# Patient Record
Sex: Male | Born: 1980
Health system: Southern US, Community
[De-identification: ages and names within clinical notes are randomized; demographics above are authoritative.]

## PROBLEM LIST (undated history)

## (undated) DIAGNOSIS — I10 Essential (primary) hypertension: Secondary | ICD-10-CM

## (undated) DIAGNOSIS — L409 Psoriasis, unspecified: Secondary | ICD-10-CM

## (undated) DIAGNOSIS — K219 Gastro-esophageal reflux disease without esophagitis: Secondary | ICD-10-CM

## (undated) DIAGNOSIS — M5412 Radiculopathy, cervical region: Secondary | ICD-10-CM

## (undated) HISTORY — DX: Essential (primary) hypertension: I10

## (undated) HISTORY — DX: Gastro-esophageal reflux disease without esophagitis: K21.9

## (undated) HISTORY — DX: Radiculopathy, cervical region: M54.12

## (undated) HISTORY — PX: TONSILLECTOMY: SUR1361

## (undated) HISTORY — DX: Psoriasis, unspecified: L40.9

---

## 2004-07-28 ENCOUNTER — Ambulatory Visit: Payer: Self-pay | Admitting: Family Medicine

## 2015-10-01 ENCOUNTER — Other Ambulatory Visit: Payer: Self-pay | Admitting: Orthopaedic Surgery

## 2015-10-01 DIAGNOSIS — M25512 Pain in left shoulder: Secondary | ICD-10-CM

## 2015-10-12 ENCOUNTER — Other Ambulatory Visit: Payer: Self-pay

## 2015-10-12 ENCOUNTER — Ambulatory Visit
Admission: RE | Admit: 2015-10-12 | Discharge: 2015-10-12 | Disposition: A | Discharge status: Home / Self Care | Payer: 59 | Source: Ambulatory Visit | Attending: Orthopaedic Surgery | Admitting: Orthopaedic Surgery

## 2015-10-12 DIAGNOSIS — M25512 Pain in left shoulder: Secondary | ICD-10-CM

## 2015-10-30 ENCOUNTER — Other Ambulatory Visit: Payer: Self-pay | Admitting: Neurosurgery

## 2015-10-30 DIAGNOSIS — M4722 Other spondylosis with radiculopathy, cervical region: Secondary | ICD-10-CM

## 2015-11-02 ENCOUNTER — Ambulatory Visit
Admission: RE | Admit: 2015-11-02 | Discharge: 2015-11-02 | Disposition: A | Discharge status: Home / Self Care | Payer: 59 | Source: Ambulatory Visit | Attending: Neurosurgery | Admitting: Neurosurgery

## 2015-11-02 DIAGNOSIS — M4722 Other spondylosis with radiculopathy, cervical region: Secondary | ICD-10-CM

## 2016-09-01 ENCOUNTER — Encounter (INDEPENDENT_AMBULATORY_CARE_PROVIDER_SITE_OTHER): Payer: Self-pay | Admitting: Orthopedic Surgery

## 2016-09-01 ENCOUNTER — Ambulatory Visit (INDEPENDENT_AMBULATORY_CARE_PROVIDER_SITE_OTHER): Payer: 59

## 2016-09-01 ENCOUNTER — Ambulatory Visit (INDEPENDENT_AMBULATORY_CARE_PROVIDER_SITE_OTHER): Payer: 59 | Admitting: Orthopedic Surgery

## 2016-09-01 VITALS — BP 125/59 | HR 86 | Resp 12 | Ht 71.0 in | Wt 185.0 lb

## 2016-09-01 DIAGNOSIS — M25511 Pain in right shoulder: Secondary | ICD-10-CM

## 2016-09-01 DIAGNOSIS — M7541 Impingement syndrome of right shoulder: Secondary | ICD-10-CM

## 2016-09-01 MED ORDER — METHYLPREDNISOLONE ACETATE 40 MG/ML IJ SUSP
80.0000 mg | INTRAMUSCULAR | Status: AC | PRN
  Administered 2016-09-01: 80 mg

## 2016-09-01 MED ORDER — BUPIVACAINE HCL 0.5 % IJ SOLN
2.0000 mL | INTRAMUSCULAR | Status: AC | PRN
  Administered 2016-09-01: 2 mL via INTRA_ARTICULAR

## 2016-09-01 MED ORDER — LIDOCAINE HCL 1 % IJ SOLN
2.0000 mL | INTRAMUSCULAR | Status: AC | PRN
Start: 2016-09-01 — End: 2016-09-01
  Administered 2016-09-01: 2 mL

## 2016-09-01 NOTE — Progress Notes (Signed)
Office Visit Note   Patient: Derrick BanningMatthew Munoz           Date of Birth: [birth year]           MRN: [id number] Visit Date: 09/01/2016              Requested by: No referring provider defined for this encounter. PCP: No PCP Per Patient   Assessment & Plan: Visit Diagnoses:  1. Impingement syndrome of right shoulder   2. Right shoulder pain, unspecified chronicity     Plan:  #1: Steroid injection to the subacromial space was performed. Postinjection he had no pain in any maneuvers. #2: If he would like I will reinject the left shoulder if he so desires  Follow-Up Instructions: No Follow-up on file.   Orders:  Orders Placed This Encounter  Procedures  . XR Shoulder Right   No orders of the defined types were placed in this encounter.     Procedures: Large Joint Inj Date/Time: 09/01/2016 5:19 PM Performed by: Jacqualine CodePETRARCA, BRIAN D Authorized by: Jacqualine CodePETRARCA, BRIAN D   Consent Given by:  Patient Timeout: prior to procedure the correct patient, procedure, and site was verified   Indications:  Pain Location:  Shoulder Site:  L subacromial bursa Prep: patient was prepped and draped in usual sterile fashion   Needle Size:  25 G Needle Length:  1.5 inches Approach:  Lateral Ultrasound Guidance: No   Fluoroscopic Guidance: No   Arthrogram: No   Medications:  80 mg methylPREDNISolone acetate 40 MG/ML; 2 mL lidocaine 1 %; 2 mL bupivacaine 0.5 % Aspiration Attempted: No   Patient tolerance:  Patient tolerated the procedure well with no immediate complications     Clinical Data: No additional findings.   Subjective: Chief Complaint  Patient presents with  . Left Shoulder - Pain  . Right Shoulder - Pain    Bil. Shoulder pain, right worse than left, no surgery to shoulders, not diabetic  Right shoulder pain x 2 months, no injury, popping, lifting weights over head causes pain Left shoulder pain x 2 years, no injury, dull, popping, MRI shows bone spurs, bursitis  Derrick Munoz  has been working out doing a lot of overhead lifting which I believe is part of the reason why his having this increasing pain discomfort. His right shoulder at this time is the worst. He does state the left one is still painful. That one was along with an MRI scan which has an os acromiale he noted.    Review of Systems  Constitutional: Negative.   HENT: Negative.   Respiratory: Negative.   Cardiovascular: Negative.   Gastrointestinal: Negative.   Genitourinary: Negative.   Skin: Negative.   Neurological: Negative.   Hematological: Negative.   Psychiatric/Behavioral: Negative.      Objective: Vital Signs: BP (!) 125/59 (BP Location: Right Arm, Patient Position: Sitting, Cuff Size: Normal)   Pulse 86   Resp 12   Ht 5\' 11"  (1.803 m)   Wt 185 lb (83.9 kg)   BMI 25.80 kg/m   Physical Exam  Constitutional: He is oriented to person, place, and time. He appears well-developed and well-nourished.  HENT:  Head: Normocephalic and atraumatic.  Eyes: EOM are normal. Pupils are equal, round, and reactive to light.  Pulmonary/Chest: Effort normal.  Neurological: He is alert and oriented to person, place, and time.  Skin: Skin is warm and dry.  Psychiatric: He has a normal mood and affect. His behavior is normal. Judgment and thought content normal.  Right Shoulder Exam   Range of Motion  Active Abduction: 140  Passive Abduction: 170  Forward Flexion: 170  External Rotation: 70  Internal Rotation 90 degrees: 40   Muscle Strength  Abduction: 4/5  Internal Rotation: 4/5  External Rotation: 4/5  Supraspinatus: 4/5  Subscapularis: 4/5   Tests  Impingement: positive  Other  Erythema: absent Sensation: normal Pulse: present  Comments:  Pain with internal rotation "empty can" testing both forward flexion and abduction.  Post injection he had no pain in any of the planes and he had no pain with empty can testing.      Specialty Comments:  No specialty comments  available.  Imaging: No results found.   PMFS History: There are no active problems to display for this patient.  Past Medical History:  Diagnosis Date  . Psoriasis     Family History  Problem Relation Age of Onset  . Hypertension Father     Past Surgical History:  Procedure Laterality Date  . TONSILLECTOMY     Social History   Occupational History  . Not on file.   Social History Main Topics  . Smoking status: Former Smoker    Packs/day: 1.00    Years: 11.00    Types: Cigarettes    Quit date: 09/01/2008  . Smokeless tobacco: Never Used  . Alcohol use Yes     Comment: 3-4 beers per weekend  . Drug use: No  . Sexual activity: Not on file

## 2017-02-08 IMAGING — MR MR SHOULDER*L* W/O CM
4 of 5 series · 30 of 40 positions shown · non-contrast
Comparison: None.

CLINICAL DATA: Chronic left shoulder pain worsening over the past 4
years. Painful range of motion.

EXAM:
MRI OF THE LEFT SHOULDER WITHOUT CONTRAST
TECHNIQUE: Multiplanar, multisequence MR imaging of the shoulder was performed.
No intravenous contrast was administered.

[Series 5: T2 fat-sat · axial · 4.0mm · 0.55mm/px · z∈[-50,+26]mm · 8 of 17 slices shown (1 of 3)]
[im 1/17]
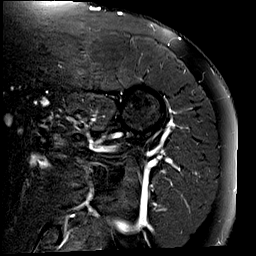
[im 3/17]
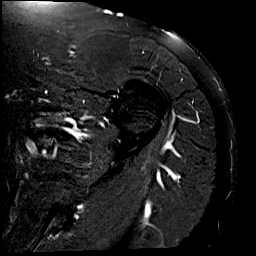
[im 5/17]
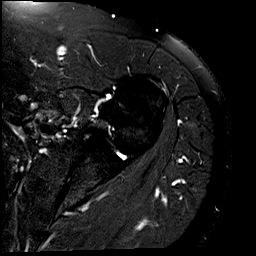
[im 7/17]
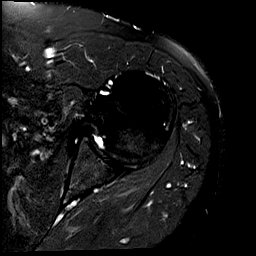
[im 10/17]
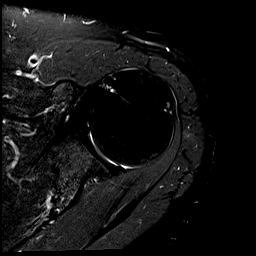
[im 12/17]
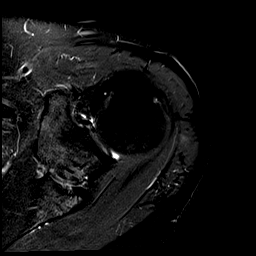
[im 14/17]
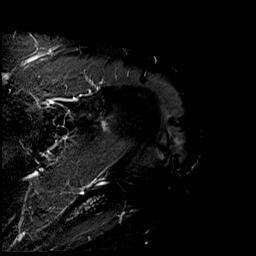
[im 17/17]
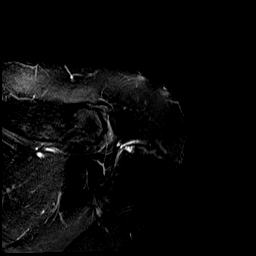

[Series 7: T2 fat-sat · oblique · 4.0mm · 0.59mm/px · 8 of 17 slices shown (2 of 3)]
[im 1/17]
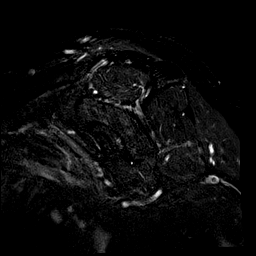
[im 3/17]
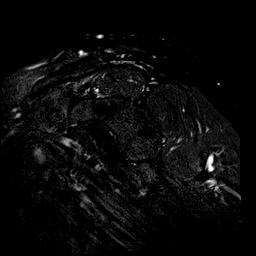
[im 5/17]
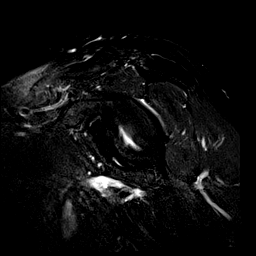
[im 7/17]
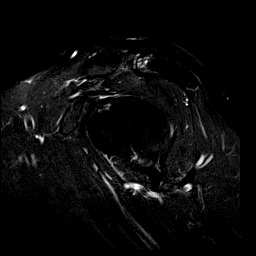
[im 10/17]
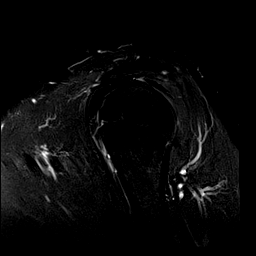
[im 12/17]
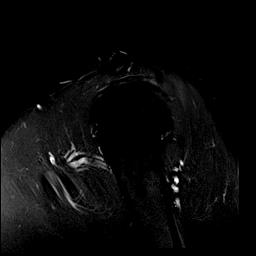
[im 14/17]
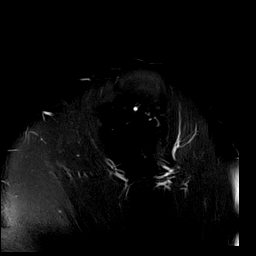
[im 17/17]
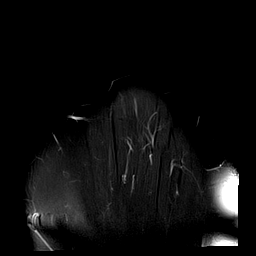

[Series 10: PD · oblique · 4.0mm · 0.29mm/px · 8 of 19 slices shown]
[im 1/19]
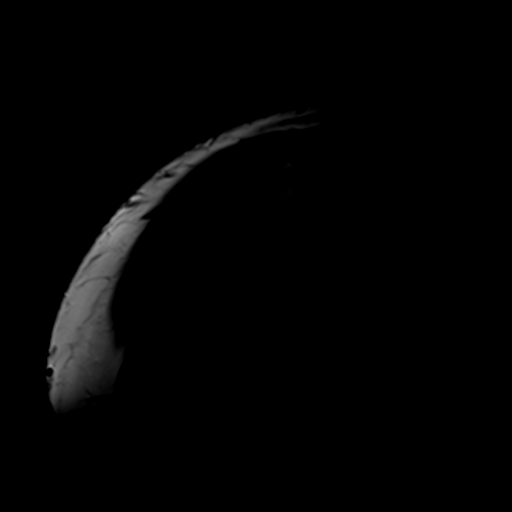
[im 3/19]
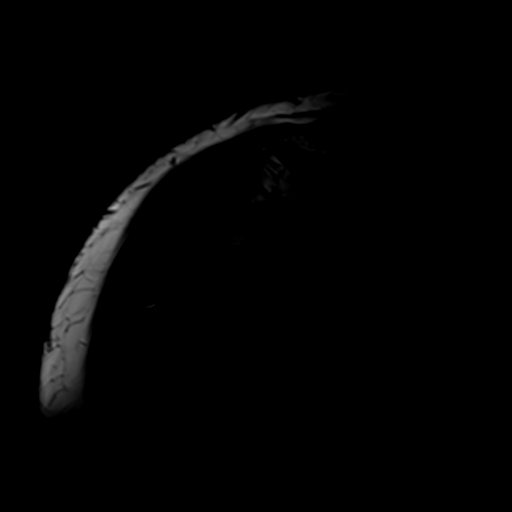
[im 6/19]
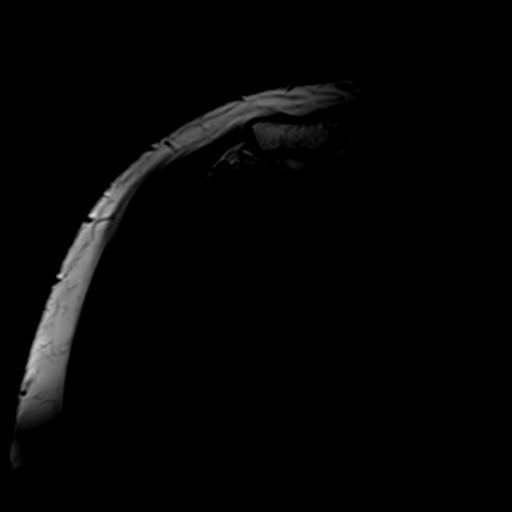
[im 8/19]
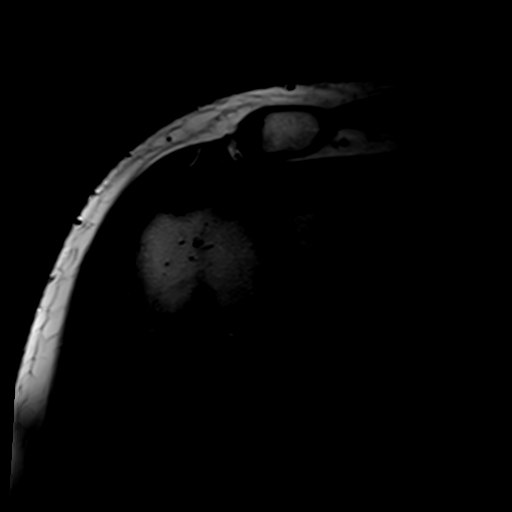
[im 11/19]
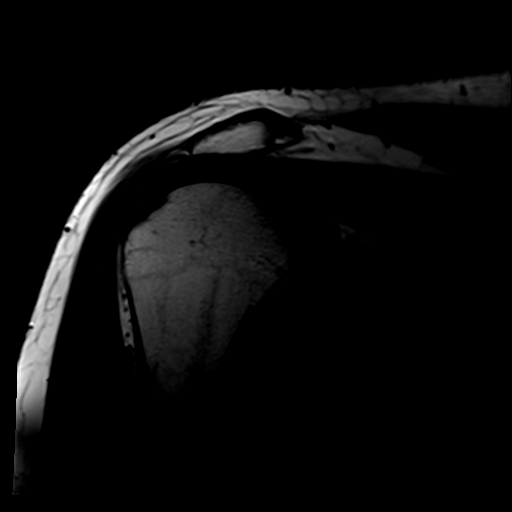
[im 13/19]
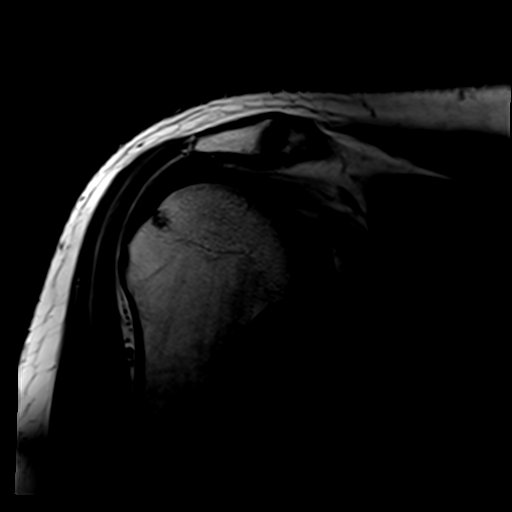
[im 16/19]
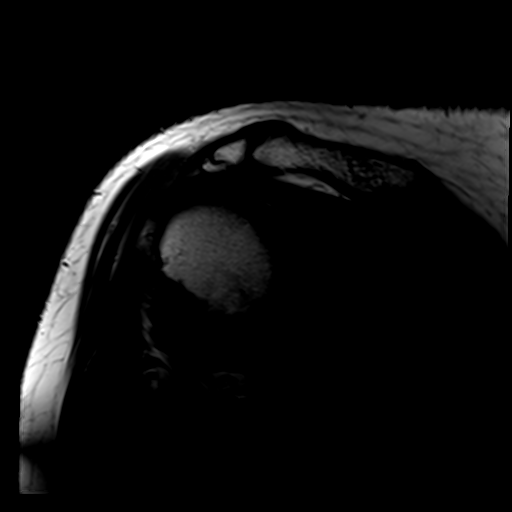
[im 19/19]
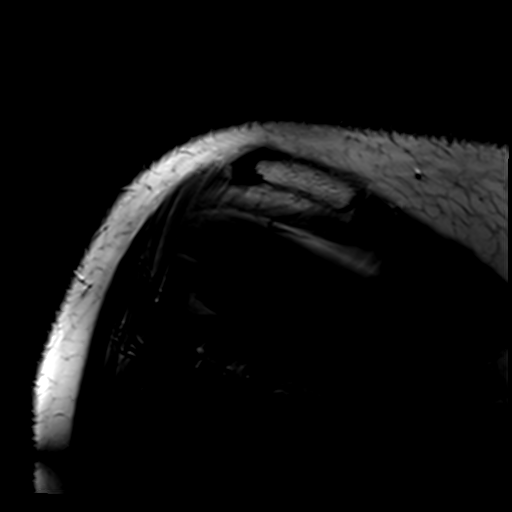

[Series 11: T2 fat-sat · oblique · 4.0mm · 0.29mm/px · 6 of 19 slices shown (3 of 3)]
[im 1/19]
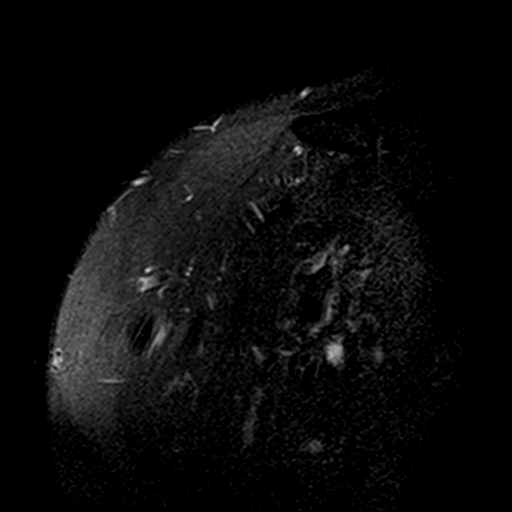
[im 3/19]
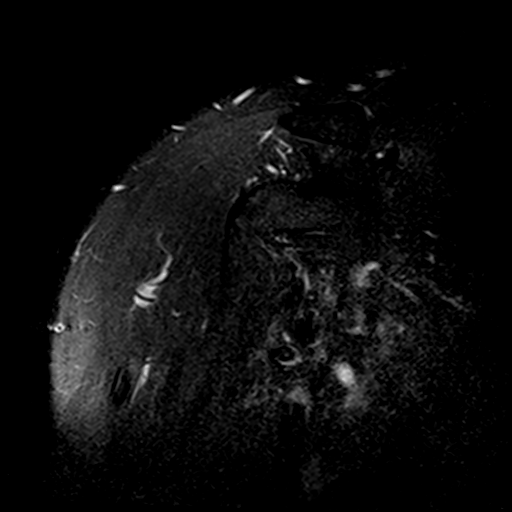
[im 6/19]
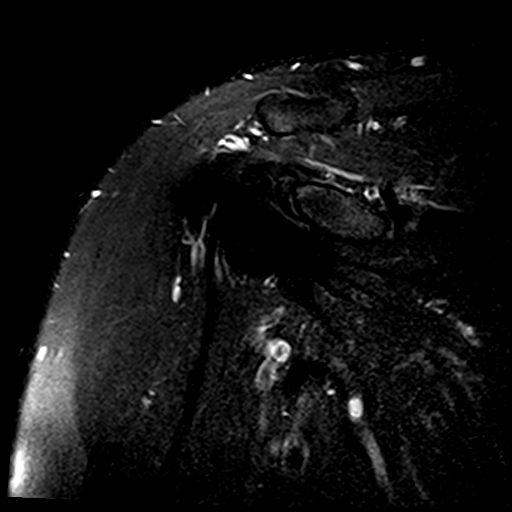
[im 8/19]
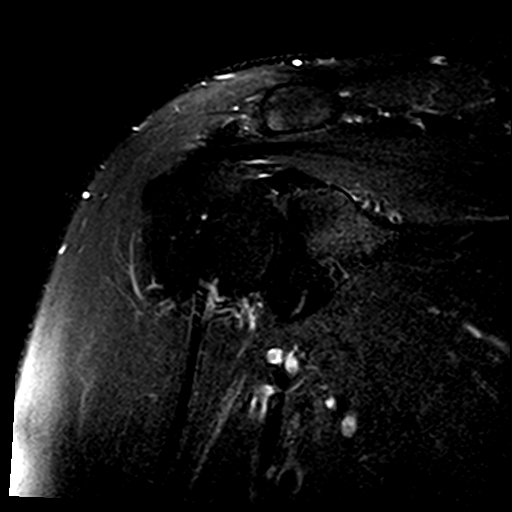
[im 11/19]
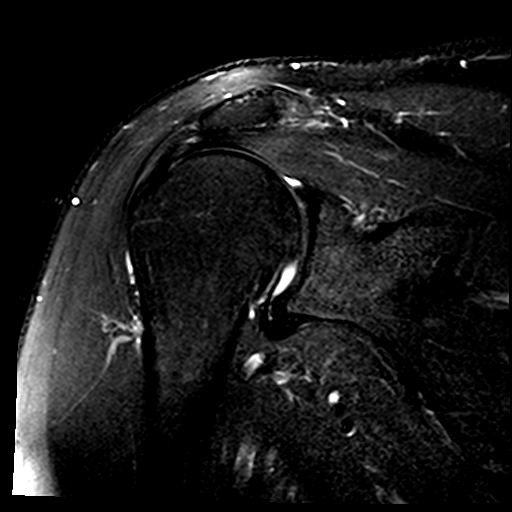
[im 16/19]
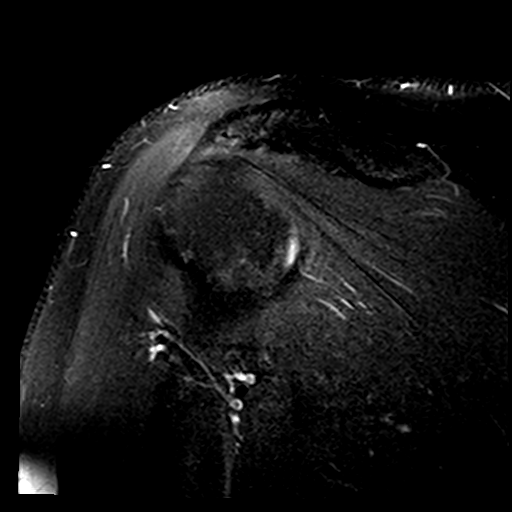

[30 of 40 positions shown; findings below may reference images not displayed]

FINDINGS: Rotator cuff: Moderate rotator cuff tendinopathy/ tendinosis.
Shallow bursal surface tear is noted at the musculotendinous
junction region of the supraspinatus tendon. No full thickness
retracted tear. The subscapularis tendon is intact.

Muscles:  Normal.

Biceps long head:  Intact.

Acromioclavicular Joint: Mild AC joint degenerative changes. Type 2
acromion with os acromiale. No lateral downsloping or significant
undersurface spurring.

Glenohumeral Joint: Small joint effusion. Synovitis versus adhesive
capsulitis.

Labrum:  No definite labral tears.

Bones:  No acute bony findings.
IMPRESSION: 1. Moderate rotator cuff tendinopathy/tendinosis. Shallow bursal
surface tear noted at the musculotendinous junction region of the
supraspinatus tendon. No full thickness retracted tear.
2. Intact long head biceps tendon and normal glenoid labrum.
3. Mild AC joint degenerative changes. Type 2 acromion with os
acromial.
4. Small joint effusion.  Synovitis versus adhesive capsulitis.

## 2018-01-12 ENCOUNTER — Encounter: Payer: Self-pay | Admitting: Emergency Medicine

## 2018-01-12 ENCOUNTER — Other Ambulatory Visit: Payer: Self-pay

## 2018-01-12 ENCOUNTER — Emergency Department (INDEPENDENT_AMBULATORY_CARE_PROVIDER_SITE_OTHER)
Admission: EM | Admit: 2018-01-12 | Discharge: 2018-01-12 | Disposition: A | Discharge status: Home / Self Care | Payer: PRIVATE HEALTH INSURANCE | Source: Home / Self Care

## 2018-01-12 DIAGNOSIS — L03211 Cellulitis of face: Secondary | ICD-10-CM

## 2018-01-12 MED ORDER — DOXYCYCLINE HYCLATE 100 MG PO TABS: 100.0000 mg | ORAL_TABLET | Freq: Two times a day (BID) | ORAL | 0 refills | Status: DC

## 2018-01-12 NOTE — Discharge Instructions (Addendum)
Avoid prolonged sun exposure while taking doxycycline since it is a photosensitizer and makes you more likely to get a sunburn.

## 2018-01-12 NOTE — ED Triage Notes (Signed)
Left eye swelling, red, painful, started with a small bump in the brow, today lid and brow are red and swollen.

## 2018-01-12 NOTE — ED Provider Notes (Signed)
[facility]   [id number] 01/12/18 Arrival Time: 1530   SUBJECTIVE:  Derrick BanningMatthew Munoz is a 37 y.o. male who presents to the urgent care with complaint of Left eye swelling, red, painful, started with a small bump in the brow, today lid and brow are red and swollen.  Patient states that the abrasion above his left eyebrow was encouraged yesterday and he is developed progressive swelling in the area since.  He said no fever or change in his vision.  There is no eye pain per se.     Past Medical History:  Diagnosis Date  . Psoriasis    Family History  Problem Relation Age of Onset  . Hypertension Father    Social History   Socioeconomic History  . Marital status: Married    Spouse name: Not on file  . Number of children: Not on file  . Years of education: Not on file  . Highest education level: Not on file  Occupational History  . Not on file  Social Needs  . Financial resource strain: Not on file  . Food insecurity:    Worry: Not on file    Inability: Not on file  . Transportation needs:    Medical: Not on file    Non-medical: Not on file  Tobacco Use  . Smoking status: Former Smoker    Packs/day: 1.00    Years: 11.00    Pack years: 11.00    Types: Cigarettes    Last attempt to quit: 09/01/2008    Years since quitting: 9.3  . Smokeless tobacco: Never Used  Substance and Sexual Activity  . Alcohol use: Yes    Comment: 3-4 beers per weekend  . Drug use: No  . Sexual activity: Not on file  Lifestyle  . Physical activity:    Days per week: Not on file    Minutes per session: Not on file  . Stress: Not on file  Relationships  . Social connections:    Talks on phone: Not on file    Gets together: Not on file    Attends religious service: Not on file    Active member of club or organization: Not on file    Attends meetings of clubs or organizations: Not on file    Relationship status: Not on file  . Intimate partner violence:    Fear of current or ex  partner: Not on file    Emotionally abused: Not on file    Physically abused: Not on file    Forced sexual activity: Not on file  Other Topics Concern  . Not on file  Social History Narrative  . Not on file   No outpatient medications have been marked as taking for the 01/12/18 encounter [facility](Hospital Encounter).   No Known Allergies    ROS: As per HPI, remainder of ROS negative.   OBJECTIVE:   Vitals:   01/12/18 1553  BP: (!) 144/78  Pulse: 77  Temp: 98 F (36.7 C)  TempSrc: Oral  SpO2: 98%     General appearance: alert; no distress Eyes: PERRL; EOMI; conjunctiva normal HENT: normocephalic; atraumatic; TMs normal, canal normal, external ears normal without trauma; nasal mucosa normal; oral mucosa normal Neck: supple Back: no CVA tenderness Extremities: no cyanosis or edema; symmetrical with no gross deformities Skin: warm and dry; mild swelling underneath an abrasion at the lateral aspect of the left eyebrow with mild erythema and swelling of the upper lid on the ipsilateral side without any fluctuance Neurologic:  normal gait; grossly normal Psychological: alert and cooperative; normal mood and affect      Labs:  No results found for this or any previous visit.  Labs Reviewed - No data to display  No results found.     ASSESSMENT & PLAN:  1. Facial cellulitis     Meds ordered this encounter  Medications  . doxycycline (VIBRA-TABS) 100 MG tablet    Sig: Take 1 tablet (100 mg total) by mouth 2 (two) times daily.    Dispense:  20 tablet    Refill:  0    Reviewed expectations re: course of current medical issues. Questions answered. Outlined signs and symptoms indicating need for more acute intervention. Patient verbalized understanding. After Visit Summary given.    Procedures:      Elvina Sidle, MD 01/12/18 [phone]

## 2018-08-08 ENCOUNTER — Ambulatory Visit: Payer: Federal, State, Local not specified - PPO | Admitting: Osteopathic Medicine

## 2018-08-08 ENCOUNTER — Encounter: Payer: Self-pay | Admitting: Osteopathic Medicine

## 2018-08-08 VITALS — BP 117/73 | HR 103 | Temp 97.7°F | Ht 71.0 in | Wt 187.8 lb

## 2018-08-08 DIAGNOSIS — M542 Cervicalgia: Secondary | ICD-10-CM

## 2018-08-08 DIAGNOSIS — K219 Gastro-esophageal reflux disease without esophagitis: Secondary | ICD-10-CM | POA: Diagnosis not present

## 2018-08-08 DIAGNOSIS — Z0000 Encounter for general adult medical examination without abnormal findings: Secondary | ICD-10-CM

## 2018-08-08 DIAGNOSIS — Z872 Personal history of diseases of the skin and subcutaneous tissue: Secondary | ICD-10-CM | POA: Diagnosis not present

## 2018-08-08 MED ORDER — PANTOPRAZOLE SODIUM 40 MG PO TBEC
40.0000 mg | DELAYED_RELEASE_TABLET | Freq: Every day | ORAL | 3 refills | Status: DC
Start: 2018-08-08 — End: 2018-11-09

## 2018-08-08 NOTE — Patient Instructions (Signed)
General Preventive Care  Most recent routine screening lipids/other labs: ordered today.   Cholesterol and Diabetes screening usually recommended annually.   Thyroid and Vitamin D: routine screening is not medically necessary, therefore most insurance will not cover this test as part of "free labs" on your annual physical. If you desire this testing, you may be charged for it! Please check with the lab before your blood draw if you're concerned about cost.   Everyone should have blood pressure checked once per year.   Tobacco: don't!  Alcohol: responsible moderation is ok for most adults - if you have concerns about your alcohol intake, please talk to me!   Exercise: as tolerated to reduce risk of cardiovascular disease and diabetes. Strength training will also prevent osteoporosis.   Mental health: if need for mental health care (medicines, counseling, other), or concerns about moods, please let me know!   Sexual health: if ever a need for STD testing, or if concerns with libido/pain problems, please let me know!  Advanced Directive: Living Will and/or Healthcare Power of Attorney recommended for all adults, regardless of age or health.  Vaccines  Flu vaccine: recommended for almost everyone, every fall.   Shingles vaccine: Shingrix recommended after age 32.   Pneumonia vaccines: Prevnar and Pneumovax recommended after age 82, or sooner if certain medical conditions.  Tetanus booster: Tdap recommended every 10 years.  Cancer screenings   Colon cancer screening: recommended for everyone at age 26, but some folks need a colonoscopy sooner if risk factors   Prostate cancer screening: PSA blood test around age 10  Lung cancer screening: not needed given light smoking history and quit Infection screenings . HIV, Gonorrhea/Chlamydia: screening as needed, though many insurances require testing for anyone on birth control pills. . Hepatitis C: recommended for anyone born 02-1963 =  not needed . TB: certain at-risk populations, or depending on work requirements and/or travel history Other . Bone Density Test: recommended for men at age 52, sooner depending on risk factors . Abdominal Aortic Aneurysm: screening with ultrasound recommended once for men age 18-75 who have ever smoked

## 2018-08-08 NOTE — Progress Notes (Signed)
HPI: Derrick Munoz is a 38 y.o. male who  has a past medical history of GERD (gastroesophageal reflux disease) and Psoriasis.  [facility], 08/08/18,  for chief complaint of: New to establish Annual check-up Neck irritation    Patient here for annual physical / wellness exam.  See preventive care reviewed as below.    Additional concerns today include:   Would like refill of pantoprazole  Neck discomfort along left jawline seems to have started after he had been out of the pantoprazole about a week and a half ago.  Hurts just under the jaw when he is stretching the neck, no lumps or bumps, had some mild sore throat about a week and a half ago      Past medical, surgical, social and family history reviewed:  Patient Active Problem List   Diagnosis Date Noted  . Gastroesophageal reflux disease 08/08/2018  . Pain in the neck 08/08/2018    Past Surgical History:  Procedure Laterality Date  . TONSILLECTOMY      Social History   Tobacco Use  . Smoking status: Former Smoker    Packs/day: 1.00    Years: 11.00    Pack years: 11.00    Types: Cigarettes    Last attempt to quit: 09/01/2008    Years since quitting: 9.9  . Smokeless tobacco: Never Used  Substance Use Topics  . Alcohol use: Yes    Comment: 3-4 beers per weekend    Family History  Problem Relation Age of Onset  . Hypertension Father   . Prostate cancer Maternal Uncle      Current medication list and allergy/intolerance information reviewed:    Current Outpatient Medications  Medication Sig Dispense Refill  . pantoprazole (PROTONIX) 40 MG tablet Take 1 tablet (40 mg total) by mouth daily. 30 tablet 3   No current facility-administered medications for this visit.     No Known Allergies    Review of Systems:  Constitutional:  No  fever, no chills, No recent illness, No unintentional weight changes. No significant fatigue.   HEENT: No   headache, no vision change, no hearing change, No sore throat, No  sinus pressure  Cardiac: No  chest pain, No  pressure, No palpitations, No  Orthopnea  Respiratory:  No  shortness of breath. No  Cough  Gastrointestinal: No  abdominal pain, No  nausea, No  vomiting,  No  blood in stool, No  diarrhea, No  constipation   Musculoskeletal: No new myalgia/arthralgia  Skin: No  Rash, No other wounds/concerning lesions  Genitourinary: No  incontinence, No  abnormal genital bleeding, No abnormal genital discharge  Hem/Onc: No  easy bruising/bleeding, No  abnormal lymph node  Endocrine: No cold intolerance,  No heat intolerance. No polyuria/polydipsia/polyphagia   Neurologic: No  weakness, No  dizziness, No  slurred speech/focal weakness/facial droop  Psychiatric: No  concerns with depression, No  concerns with anxiety, No sleep problems, No mood problems  Exam:  BP 117/73 (BP Location: Left Arm, Patient Position: Sitting, Cuff Size: Normal)   Pulse (!) 103   Temp 97.7 F (36.5 C) (Oral)   Ht 5\' 11"  (1.803 m)   Wt 187 lb 12.8 oz (85.2 kg)   BMI 26.19 kg/m   Constitutional: VS see above. General Appearance: alert, well-developed, well-nourished, NAD  Eyes: Normal lids and conjunctive, non-icteric sclera  Ears, Nose, Mouth, Throat: MMM, Normal external inspection ears/nares/mouth/lips/gums. TM normal bilaterally. Pharynx/tonsils no erythema, no exudate.  Nasal mucosa normal.   Neck: No masses, trachea midline. No thyroid enlargement. No tenderness/mass appreciated. No lymphadenopathy  Respiratory: Normal respiratory effort. no wheeze, no rhonchi, no rales  Cardiovascular: S1/S2 normal, no murmur, no rub/gallop auscultated. RRR. No lower extremity edema.  Musculoskeletal: Gait normal.   Neurological: Normal balance/coordination. No tremor.   Skin: warm, dry, intact. No rash/ulcer.  Psychiatric: Normal judgment/insight. Normal mood and affect. Oriented x3.       ASSESSMENT/PLAN: The primary encounter diagnosis was Annual physical exam. Diagnoses of Gastroesophageal reflux disease, esophagitis presence not specified and Pain in the neck were also pertinent to this visit.   I do not appreciate any lymphadenopathy on exam, there is really no tenderness, hurts when patient stretches his neck, side bending to the right.  Pharynx also appears normal.  I think possibly reactive lymphadenopathy which is resolving, possible muscle strain of SCM or platysma, patient is okay with watchful waiting and I think this is the most appropriate course of action at this time, let me know if changes or gets worse, or persist after couple weeks back on the pantoprazole  Orders Placed This Encounter  Procedures  . CBC  . COMPLETE METABOLIC PANEL WITH GFR  . Lipid panel  . TSH  . VITAMIN D 25 Hydroxy (Vit-D Deficiency, Fractures)    Meds ordered this encounter  Medications  . pantoprazole (PROTONIX) 40 MG tablet    Sig: Take 1 tablet (40 mg total) by mouth daily.    Dispense:  30 tablet    Refill:  3    Patient Instructions  General Preventive Care  Most recent routine screening lipids/other labs: ordered today.   Cholesterol and Diabetes screening usually recommended annually.   Thyroid and Vitamin D: routine screening is not medically necessary, therefore most insurance will not cover this test as part of "free labs" on your annual physical. If you desire this testing, you may be charged for it! Please check with the lab before your blood draw if you're concerned about cost.   Everyone should have blood pressure checked once per year.   Tobacco: don't!  Alcohol: responsible moderation is ok for most adults - if you have concerns about your alcohol intake, please talk to me!   Exercise: as tolerated to reduce risk of cardiovascular disease and diabetes. Strength training will also prevent osteoporosis.   Mental health: if need for mental health  care (medicines, counseling, other), or concerns about moods, please let me know!   Sexual health: if ever a need for STD testing, or if concerns with libido/pain problems, please let me know!  Advanced Directive: Living Will and/or Healthcare Power of Attorney recommended for all adults, regardless of age or health.  Vaccines  Flu vaccine: recommended for almost everyone, every fall.   Shingles vaccine: Shingrix recommended after age 65.   Pneumonia vaccines: Prevnar and Pneumovax recommended after age 75, or sooner if certain medical conditions.  Tetanus booster: Tdap recommended every 10 years.  Cancer screenings   Colon cancer screening: recommended for everyone at age 61, but some folks need a colonoscopy sooner if risk factors   Prostate cancer screening: PSA blood test around age 69  Lung cancer screening: not needed given light smoking history and quit Infection screenings . HIV, Gonorrhea/Chlamydia: screening as needed, though many insurances require testing for anyone on birth control pills. . Hepatitis C: recommended for anyone born 34-1964 = not needed . TB: certain at-risk populations, or depending on work requirements and/or travel history  Other . Bone Density Test: recommended for men at age 80, sooner depending on risk factors . Abdominal Aortic Aneurysm: screening with ultrasound recommended once for men age 32-75 who have ever smoked             Visit summary with medication list and pertinent instructions was printed for patient to review. All questions at time of visit were answered - patient instructed to contact office with any additional concerns or updates. ER/RTC precautions were reviewed with the patient.     Please note: voice recognition software was used to produce this document, and typos may escape review. Please contact Dr. Lyn Hollingshead for any needed clarifications.     Follow-up plan: Return in about 1 year (around 08/08/2019) for annual  check-up / physical / labs. See me sooner if needed! Marland Kitchen

## 2018-08-09 LAB — COMPLETE METABOLIC PANEL WITH GFR
AG Ratio: 1.9 (calc) (ref 1.0–2.5)
ALKALINE PHOSPHATASE (APISO): 79 U/L (ref 36–130)
ALT: 25 U/L (ref 9–46)
AST: 23 U/L (ref 10–40)
Albumin: 4.8 g/dL (ref 3.6–5.1)
BUN: 16 mg/dL (ref 7–25)
CALCIUM: 9.9 mg/dL (ref 8.6–10.3)
CO2: 32 mmol/L (ref 20–32)
CREATININE: 0.89 mg/dL (ref 0.60–1.35)
Chloride: 102 mmol/L (ref 98–110)
GFR, EST NON AFRICAN AMERICAN: 110 mL/min/{1.73_m2} (ref >=60)
GFR, Est African American: 127 mL/min/{1.73_m2} (ref >=60)
GLOBULIN: 2.5 g/dL (ref 1.9–3.7)
Glucose, Bld: 93 mg/dL (ref 65–99)
POTASSIUM: 4.7 mmol/L (ref 3.5–5.3)
SODIUM: 142 mmol/L (ref 135–146)
Total Bilirubin: 0.6 mg/dL (ref 0.2–1.2)
Total Protein: 7.3 g/dL (ref 6.1–8.1)

## 2018-08-09 LAB — CBC
HEMATOCRIT: 43.8 % (ref 38.5–50.0)
HEMOGLOBIN: 15.1 g/dL (ref 13.2–17.1)
MCH: 29.3 pg (ref 27.0–33.0)
MCHC: 34.5 g/dL (ref 32.0–36.0)
MCV: 85 fL (ref 80.0–100.0)
MPV: 10.6 fL (ref 7.5–12.5)
Platelets: 241 10*3/uL (ref 140–400)
RBC: 5.15 10*6/uL (ref 4.20–5.80)
RDW: 13 % (ref 11.0–15.0)
WBC: 4.4 10*3/uL (ref 3.8–10.8)

## 2018-08-09 LAB — VITAMIN D 25 HYDROXY (VIT D DEFICIENCY, FRACTURES): Vit D, 25-Hydroxy: 44 ng/mL (ref 30–100)

## 2018-08-09 LAB — LIPID PANEL
CHOL/HDL RATIO: 3.1 (calc) (ref <5.0)
CHOLESTEROL: 183 mg/dL (ref <200)
HDL: 59 mg/dL (ref >=40)
LDL Cholesterol (Calc): 103 mg/dL (calc) — ABNORMAL HIGH
NON-HDL CHOLESTEROL (CALC): 124 mg/dL (ref <130)
TRIGLYCERIDES: 113 mg/dL (ref <150)

## 2018-08-09 LAB — TSH: TSH: 2.14 mIU/L (ref 0.40–4.50)

## 2018-11-08 ENCOUNTER — Encounter: Payer: Self-pay | Admitting: Osteopathic Medicine

## 2018-11-09 MED ORDER — PANTOPRAZOLE SODIUM 40 MG PO TBEC: 40.0000 mg | DELAYED_RELEASE_TABLET | Freq: Every day | ORAL | 3 refills | Status: DC

## 2018-11-15 NOTE — Telephone Encounter (Signed)
Received an approval from [place] that Protonix has been approved from 10/14/2018 through 11/13/2019. Pharmacy aware and form sent to scan.

## 2018-12-05 DIAGNOSIS — K080 Exfoliation of teeth due to systemic causes: Secondary | ICD-10-CM | POA: Diagnosis not present

## 2018-12-14 ENCOUNTER — Other Ambulatory Visit: Payer: Self-pay | Admitting: Osteopathic Medicine

## 2019-03-15 ENCOUNTER — Other Ambulatory Visit: Payer: Self-pay | Admitting: Osteopathic Medicine

## 2019-03-20 DIAGNOSIS — W3400XA Accidental discharge from unspecified firearms or gun, initial encounter: Secondary | ICD-10-CM

## 2019-03-20 DIAGNOSIS — S3991XA Unspecified injury of abdomen, initial encounter: Secondary | ICD-10-CM | POA: Diagnosis not present

## 2019-03-20 DIAGNOSIS — S31104A Unspecified open wound of abdominal wall, left lower quadrant without penetration into peritoneal cavity, initial encounter: Secondary | ICD-10-CM | POA: Diagnosis not present

## 2019-03-20 DIAGNOSIS — Z6826 Body mass index (BMI) 26.0-26.9, adult: Secondary | ICD-10-CM | POA: Diagnosis not present

## 2019-03-20 DIAGNOSIS — R109 Unspecified abdominal pain: Secondary | ICD-10-CM | POA: Diagnosis not present

## 2019-03-20 DIAGNOSIS — W3409XA Accidental discharge from other specified firearms, initial encounter: Secondary | ICD-10-CM | POA: Diagnosis not present

## 2019-03-20 DIAGNOSIS — S31149A Puncture wound of abdominal wall with foreign body, unspecified quadrant without penetration into peritoneal cavity, initial encounter: Secondary | ICD-10-CM | POA: Diagnosis not present

## 2019-03-20 DIAGNOSIS — T182XXA Foreign body in stomach, initial encounter: Secondary | ICD-10-CM | POA: Diagnosis not present

## 2019-03-20 HISTORY — DX: Accidental discharge from unspecified firearms or gun, initial encounter: W34.00XA

## 2019-05-19 ENCOUNTER — Other Ambulatory Visit: Payer: Self-pay | Admitting: Osteopathic Medicine

## 2019-05-20 NOTE — Telephone Encounter (Signed)
Requested medication (s) are due for refill today: yes  Requested medication (s) are on the active medication list:yes  Last refill:  04/23/2019  Future visit scheduled: yes  Notes to clinic:  review for refill   Requested Prescriptions  Pending Prescriptions Disp Refills   pantoprazole (PROTONIX) 40 MG tablet [Pharmacy Med Name: PANTOPRAZOLE SOD DR 40 MG TAB] 30 tablet 3    Sig: TAKE 1 TABLET BY MOUTH EVERY DAY      Gastroenterology: Proton Pump Inhibitors Passed - 05/19/2019  9:38 AM      Passed - Valid encounter within last 12 months    Recent Outpatient Visits           9 months ago Annual physical exam   [facility], Lanelle Bal, DO       Future Appointments             In 2 months Emeterio Reeve, [facility]

## 2019-08-02 DIAGNOSIS — K080 Exfoliation of teeth due to systemic causes: Secondary | ICD-10-CM | POA: Diagnosis not present

## 2019-08-08 ENCOUNTER — Encounter: Payer: PRIVATE HEALTH INSURANCE | Admitting: Osteopathic Medicine

## 2019-08-16 ENCOUNTER — Other Ambulatory Visit: Payer: Self-pay | Admitting: Osteopathic Medicine

## 2019-08-16 NOTE — Telephone Encounter (Signed)
Requested  medications are  due for refill today yes  Requested medications are on the active medication list yes  Last refill 12/14  Future visit scheduled 3/15

## 2019-08-20 ENCOUNTER — Encounter: Payer: Federal, State, Local not specified - PPO | Admitting: Family Medicine

## 2019-12-03 ENCOUNTER — Encounter: Payer: Self-pay | Admitting: Osteopathic Medicine

## 2019-12-28 NOTE — Telephone Encounter (Signed)
The Prior Authorization request has been approved for Pantoprazole Sodium 40MG  OR TBEC. The authorization is valid from 11/28/2019 through 12/27/2021. A letter of explanation will also be mailed to the patient. Pharmacy aware.

## 2020-02-04 ENCOUNTER — Encounter: Payer: Self-pay | Admitting: Osteopathic Medicine

## 2020-02-06 MED ORDER — GUAIFENESIN-CODEINE 100-10 MG/5ML PO SYRP: 5.0000 mL | ORAL_SOLUTION | Freq: Four times a day (QID) | ORAL | 0 refills | Status: DC | PRN

## 2020-03-28 ENCOUNTER — Other Ambulatory Visit: Payer: Self-pay | Admitting: Osteopathic Medicine

## 2020-10-03 ENCOUNTER — Other Ambulatory Visit: Payer: Self-pay | Admitting: Osteopathic Medicine

## 2020-11-06 ENCOUNTER — Other Ambulatory Visit: Payer: Self-pay | Admitting: Osteopathic Medicine

## 2022-03-24 ENCOUNTER — Ambulatory Visit: Payer: Federal, State, Local not specified - PPO | Admitting: Orthopedic Surgery

## 2022-03-24 ENCOUNTER — Ambulatory Visit (INDEPENDENT_AMBULATORY_CARE_PROVIDER_SITE_OTHER): Payer: Federal, State, Local not specified - PPO

## 2022-03-24 ENCOUNTER — Encounter: Payer: Self-pay | Admitting: Orthopedic Surgery

## 2022-03-24 DIAGNOSIS — M542 Cervicalgia: Secondary | ICD-10-CM | POA: Diagnosis not present

## 2022-03-24 MED ORDER — METHOCARBAMOL 500 MG PO TABS: 500.0000 mg | ORAL_TABLET | Freq: Three times a day (TID) | ORAL | 0 refills | Status: DC | PRN

## 2022-03-24 MED ORDER — NABUMETONE 500 MG PO TABS: 500.0000 mg | ORAL_TABLET | Freq: Two times a day (BID) | ORAL | 0 refills | Status: DC | PRN

## 2022-03-24 MED ORDER — TRAMADOL HCL 50 MG PO TABS: ORAL_TABLET | ORAL | 0 refills | Status: DC

## 2022-03-25 ENCOUNTER — Encounter: Payer: Self-pay | Admitting: Orthopedic Surgery

## 2022-03-25 NOTE — Progress Notes (Signed)
Office Visit Note   Patient: Derrick Munoz           Date of Birth: September 26, 1980           MRN: [id number] Visit Date: 03/24/2022 Requested by: Sunnie Nielsen, DO [address] [place],  [place] [address] PCP: Sunnie Nielsen, DO  Subjective: Chief Complaint  Patient presents with   Neck - Pain    HPI: Derrick Munoz is a 41 y.o. male who presents to the office reporting .  Neck pain.  He has a history of prior injection with Dr. Kristeen Mans that did help.  That was several years ago.  He describes pain that radiates down his arm on the left-hand side.  Ibuprofen and Goody's powders have not been helpful.  He works at the [employer].  Denies any weakness.  Symptoms are worse with sitting.  Symptoms ongoing now for 6 weeks.  He has a lot of crepitus and "sounds" in his neck with range of motion.              ROS: All systems reviewed are negative as they relate to the chief complaint within the history of present illness.  Patient denies fevers or chills.  Assessment & Plan: Visit Diagnoses:  1. Neck pain     Plan: Impression is cervical spondylosis with likely degenerative disc disease causing pain and nerve compression.  Plan is Relafen as an anti-inflammatory along with Robaxin as a muscle relaxer and tramadol at night for pain.  MRI cervical spine to evaluate progressive degenerative disc disease with likely ESI's to follow.  He has had good response to injections in the past.  Amount of degenerative disc disease in his neck is significant enough to warrant an intervention in the form of injections and less likely surgery depending on how bad it looks.  I will have him follow-up with Dr. Alvester Morin after the MRI scanFollow-Up Instructions: No follow-ups on file.   Orders:  Orders Placed This Encounter  Procedures   XR Cervical Spine 2 or 3 views   MR Cervical Spine w/o contrast   Ambulatory referral to Physical Medicine Rehab   Meds ordered this encounter  Medications    methocarbamol (ROBAXIN) 500 MG tablet    Sig: Take 1 tablet (500 mg total) by mouth every 8 (eight) hours as needed for muscle spasms.    Dispense:  30 tablet    Refill:  0   nabumetone (RELAFEN) 500 MG tablet    Sig: Take 1 tablet (500 mg total) by mouth 2 (two) times daily as needed.    Dispense:  60 tablet    Refill:  0   traMADol (ULTRAM) 50 MG tablet    Sig: 1 po q hs prn    Dispense:  30 tablet    Refill:  0      Procedures: No procedures performed   Clinical Data: No additional findings.  Objective: Vital Signs: There were no vitals taken for this visit.  Physical Exam:  Constitutional: Patient appears well-developed HEENT:  Head: Normocephalic Eyes:EOM are normal Neck: Normal range of motion Cardiovascular: Normal rate Pulmonary/chest: Effort normal Neurologic: Patient is alert Skin: Skin is warm Psychiatric: Patient has normal mood and affect  Ortho Exam: Ortho exam demonstrates full active and passive range of motion of elbows wrist and shoulders.  No definite paresthesias C5-T1.  Does have some crepitus with neck range of motion.  Flexion chin to chest extension 30 degrees rotation is 45 degrees  bilaterally.  Reflexes symmetric bilateral biceps triceps 0 to 1+ out of 4.  Radial pulse intact bilaterally.  Excellent rotator cuff strength bilaterally.  Specialty Comments:  No specialty comments available.  Imaging: No results found.   PMFS History: Patient Active Problem List   Diagnosis Date Noted   Gastroesophageal reflux disease 08/08/2018   Pain in the neck 08/08/2018   History of psoriasis 08/08/2018   Past Medical History:  Diagnosis Date   GERD (gastroesophageal reflux disease)    Psoriasis     Family History  Problem Relation Age of Onset   Hypertension Father    Prostate cancer Maternal Uncle     Past Surgical History:  Procedure Laterality Date   TONSILLECTOMY     Social History   Occupational History   Occupation: Conservation officer, nature: [employer]   Tobacco Use   Smoking status: Former    Packs/day: 1.00    Years: 11.00    Total pack years: 11.00    Types: Cigarettes    Quit date: 09/01/2008    Years since quitting: 13.5   Smokeless tobacco: Never  Vaping Use   Vaping Use: Never used  Substance and Sexual Activity   Alcohol use: Yes    Comment: 3-4 beers per weekend   Drug use: No   Sexual activity: Yes    Partners: Female    Birth control/protection: Other-see comments    Comment: BTL

## 2022-03-29 ENCOUNTER — Other Ambulatory Visit: Payer: Self-pay

## 2022-03-29 MED ORDER — ACETAMINOPHEN-CODEINE 300-30 MG PO TABS: 1.0000 | ORAL_TABLET | Freq: Three times a day (TID) | ORAL | 0 refills | Status: DC | PRN

## 2022-03-29 NOTE — Telephone Encounter (Signed)
Ok for t 3 1 po q 8 # 30 thx

## 2022-03-30 ENCOUNTER — Ambulatory Visit
Admission: RE | Admit: 2022-03-30 | Discharge: 2022-03-30 | Disposition: A | Discharge status: Home / Self Care | Payer: Federal, State, Local not specified - PPO | Source: Ambulatory Visit | Attending: Orthopedic Surgery | Admitting: Orthopedic Surgery

## 2022-03-30 ENCOUNTER — Other Ambulatory Visit: Payer: Self-pay | Admitting: Surgery

## 2022-03-30 ENCOUNTER — Telehealth: Payer: Self-pay | Admitting: Orthopedic Surgery

## 2022-03-30 DIAGNOSIS — M542 Cervicalgia: Secondary | ICD-10-CM

## 2022-03-30 DIAGNOSIS — M4802 Spinal stenosis, cervical region: Secondary | ICD-10-CM | POA: Diagnosis not present

## 2022-03-30 MED ORDER — ACETAMINOPHEN-CODEINE 300-30 MG PO TABS: 1.0000 | ORAL_TABLET | Freq: Three times a day (TID) | ORAL | 0 refills | Status: DC | PRN

## 2022-03-30 NOTE — Telephone Encounter (Signed)
Pt wife called in stating that Dr. Marlou Sa was suppose to prescribe pt medication.... Pt wife stated that the pharmacy didn't receive any electronic script for pt... Pt wife stated that Dr. Marlou Sa was suppose to refer pt to Dr. Ernestina Patches for injection... Pt wife stated that pt received a mychart message that pt is scheduled with a person name luke... Pt wife is requesting a callback at [phone].Marland KitchenMarland KitchenMarland Kitchen

## 2022-03-30 NOTE — Telephone Encounter (Signed)
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  IC and talked with wife about other.

## 2022-04-01 ENCOUNTER — Ambulatory Visit: Payer: Federal, State, Local not specified - PPO | Admitting: Physical Medicine and Rehabilitation

## 2022-04-01 ENCOUNTER — Encounter: Payer: Self-pay | Admitting: Physical Medicine and Rehabilitation

## 2022-04-01 DIAGNOSIS — M7918 Myalgia, other site: Secondary | ICD-10-CM | POA: Diagnosis not present

## 2022-04-01 DIAGNOSIS — G8929 Other chronic pain: Secondary | ICD-10-CM

## 2022-04-01 DIAGNOSIS — M5412 Radiculopathy, cervical region: Secondary | ICD-10-CM

## 2022-04-01 DIAGNOSIS — M25512 Pain in left shoulder: Secondary | ICD-10-CM | POA: Diagnosis not present

## 2022-04-01 DIAGNOSIS — M542 Cervicalgia: Secondary | ICD-10-CM

## 2022-04-01 NOTE — Progress Notes (Signed)
Derrick Munoz - 41 y.o. male MRN [id number]  Date of birth: 08/14/1980  Office Visit Note: Visit Date: 04/01/2022 PCP: Sunnie Nielsen, DO Referred by: Sunnie Nielsen, DO  Subjective: Chief Complaint  Patient presents with   Neck - Pain   HPI: Derrick Munoz is a 41 y.o. male who comes in today per the request of Dr. Dorene Grebe for evaluation of chronic, worsening and severe left sided neck pain radiating to shoulder. Pain ongoing for several years, worsened over the last few months. Pain is exacerbated movement and laying flat, currently rates as 8 out of 10. Patient reports difficulty sleeping due to severe pain. Some relief of pain with home exercise regimen, rest and use of medications. Currently taking Tylenol #3 and Robaxin with good relief. Recent cervical MRI imaging exhibits multi level degenerative changes, multi level foraminal stenosis, rightward disc osteophyte complex at C5-C6 effacing ventral CSF. No high grade spinal canal stenosis. Patient underwent right C7-T1 interlaminar epidural steroid injection in our office in 2015, reports significant relief of pain with this injection. Patient works as Solicitor at [employer], reports severe pain is negatively impacting his life and making it difficult to perform job duties. Patient denies focal weakness, numbness and tingling. Patient denies recent trauma or falls.    Review of Systems  Musculoskeletal:  Positive for myalgias and neck pain.  Neurological:  Negative for tingling, sensory change, focal weakness and weakness.  All other systems reviewed and are negative.  Otherwise per HPI.  Assessment & Plan: Visit Diagnoses:    ICD-10-CM   1. Radiculopathy, cervical region  M54.12 Ambulatory referral to Physical Medicine Rehab    Ambulatory referral to Physical Therapy    2. Chronic left shoulder pain  M25.512 Ambulatory referral to Physical Medicine Rehab   G89.29 Ambulatory referral to Physical Therapy     3. Cervicalgia  M54.2 Ambulatory referral to Physical Medicine Rehab    Ambulatory referral to Physical Therapy    4. Myofascial pain syndrome  M79.18 Ambulatory referral to Physical Medicine Rehab    Ambulatory referral to Physical Therapy       Plan: Findings:  Chronic, worsening and severe left sided neck pain radiating to shoulder. Patient continues to have severe pain despite good conservative therapies such as home exercise regimen, rest and use of medications. Good relief with previous cervical epidural steroid injection in 2015. Patients clinical presentation and exam are consistent with C5/C6 nerve pattern. I also feel there is a myofascial component contributing to his pain as he does have multiple palpable trigger points to bilateral levator scapulae and trapezius muscles. Next step is to perform diagnostic and hopefully therapeutic left C7-T1 interlaminar epidural steroid injection under fluoroscopic guidance. He is not currently taking anticoagulants. I also discussed formal physical therapy regimen with focus on manual treatments and possible dry needling. Patient would like to move forward with PT as well. I will place referral to our in house team. No red flag symptoms noted upon exam today.     Meds & Orders: No orders of the defined types were placed in this encounter.   Orders Placed This Encounter  Procedures   Ambulatory referral to Physical Medicine Rehab   Ambulatory referral to Physical Therapy    Follow-up: Return for Left C7-T1 interlaminar epidural steroid injection.   Procedures: No procedures performed      Clinical History: EXAM: MRI CERVICAL SPINE WITHOUT CONTRAST   TECHNIQUE: Multiplanar, multisequence MR imaging of the cervical spine was performed.  No intravenous contrast was administered.   COMPARISON:  MRI the cervical spine 11/02/2015   FINDINGS: Alignment: Study is mildly degraded by patient motion. Slight retrolisthesis at C4-5 and C5-6 is  stable.   Vertebrae: Study is degraded by patient motion. Edematous endplate changes are noted at C5-6. Marrow signal and vertebral body heights are otherwise normal.   Cord: Normal signal and morphology.   Posterior Fossa, vertebral arteries, paraspinal tissues: Craniocervical junction is normal. Flow is present in the vertebral arteries bilaterally. Visualized intracranial contents are normal.   Disc levels:   C2-3: Uncovertebral spurring is progressed. No significant stenosis is present.   C3-4: A shallow central disc protrusion present. Uncovertebral spurring leads to moderate bilateral foraminal stenosis, right greater than left. This is stable.   C4-5: A left paramedian disc protrusion demonstrates some progression. This contacts the ventral surface the cord. Moderate left and mild right foraminal stenosis is present.   C5-6: A rightward disc osteophyte complex is present. This effaces the ventral CSF. Progressive severe right and moderate left foraminal stenosis is present.   C6-7: A broad-based disc osteophyte complex is asymmetric to the left. CCC moderate left foraminal stenosis is stable. Mild right foraminal narrowing is stable.   C7-T1: A leftward disc osteophyte complex present. Moderate foraminal stenosis has progressed bilaterally, left greater than right.   IMPRESSION: 1. Progressive multilevel spondylosis of the cervical spine as described. 2. Moderate bilateral foraminal stenosis at C3-4 has progressed, right greater than left. 3. Moderate left and mild right foraminal stenosis at C4-5 demonstrates some progression. 4. Progressive severe right and moderate left foraminal stenosis at C5-6. 5. Moderate left and mild right foraminal stenosis at C6-7 is stable. 6. Moderate foraminal stenosis bilaterally at C7-T1 has progressed, left greater than right.     Electronically Signed   By: San Morelle M.D.   On: 03/30/2022 19:28   He reports  that he quit smoking about 13 years ago. His smoking use included cigarettes. He has a 11.00 pack-year smoking history. He has never used smokeless tobacco. No results for input(s): "HGBA1C", "LABURIC" in the last 8760 hours.  Objective:  VS:  HT:    WT:   BMI:     BP:   HR: bpm  TEMP: ( )  RESP:  Physical Exam Vitals and nursing note reviewed.  HENT:     Head: Normocephalic and atraumatic.     Right Ear: External ear normal.     Left Ear: External ear normal.     Nose: Nose normal.     Mouth/Throat:     Mouth: Mucous membranes are moist.  Eyes:     Extraocular Movements: Extraocular movements intact.  Cardiovascular:     Rate and Rhythm: Normal rate.     Pulses: Normal pulses.  Pulmonary:     Effort: Pulmonary effort is normal.  Abdominal:     General: Abdomen is flat. There is no distension.  Musculoskeletal:        General: Tenderness present.     Cervical back: Tenderness present.     Comments: Discomfort noted with flexion, extension. Patient has good strength in the upper extremities including 5 out of 5 strength in wrist extension, long finger flexion and APB. There is no atrophy of the hands intrinsically. Sensation intact bilaterally. Dysesthesias noted to left C5/C6 dermatomes. Multiple palpable trigger points noted to bilateral levator scapulae and trapezius muscles. Negative Hoffman's sign.   Skin:    General: Skin is warm and dry.  Capillary Refill: Capillary refill takes less than 2 seconds.  Neurological:     Mental Status: He is alert and oriented to person, place, and time.  Psychiatric:        Mood and Affect: Mood normal.        Behavior: Behavior normal.     Ortho Exam  Imaging: No results found.  Past Medical/Family/Surgical/Social History: Medications & Allergies reviewed per EMR, new medications updated. Patient Active Problem List   Diagnosis Date Noted   Gastroesophageal reflux disease 08/08/2018   Pain in the neck 08/08/2018   History  of psoriasis 08/08/2018   Past Medical History:  Diagnosis Date   GERD (gastroesophageal reflux disease)    Psoriasis    Family History  Problem Relation Age of Onset   Hypertension Father    Prostate cancer Maternal Uncle    Past Surgical History:  Procedure Laterality Date   TONSILLECTOMY     Social History   Occupational History   Occupation: Network engineer: [employer]   Tobacco Use   Smoking status: Former    Packs/day: 1.00    Years: 11.00    Total pack years: 11.00    Types: Cigarettes    Quit date: 09/01/2008    Years since quitting: 13.5   Smokeless tobacco: Never  Vaping Use   Vaping Use: Never used  Substance and Sexual Activity   Alcohol use: Yes    Comment: 3-4 beers per weekend   Drug use: No   Sexual activity: Yes    Partners: Female    Birth control/protection: Other-see comments    Comment: BTL

## 2022-04-01 NOTE — Progress Notes (Signed)
Complains of neck pain ongoing for several months but recently worse.  Denies radicular pain or numbness.

## 2022-04-07 ENCOUNTER — Ambulatory Visit: Payer: Federal, State, Local not specified - PPO | Admitting: Orthopedic Surgery

## 2022-04-09 ENCOUNTER — Ambulatory Visit: Payer: Federal, State, Local not specified - PPO | Admitting: Surgical

## 2022-04-14 ENCOUNTER — Ambulatory Visit: Payer: Federal, State, Local not specified - PPO | Admitting: Physical Medicine and Rehabilitation

## 2022-04-14 ENCOUNTER — Ambulatory Visit: Payer: Self-pay

## 2022-04-14 VITALS — BP 129/86 | HR 76

## 2022-04-14 DIAGNOSIS — M5412 Radiculopathy, cervical region: Secondary | ICD-10-CM

## 2022-04-14 MED ORDER — METHYLPREDNISOLONE ACETATE 80 MG/ML IJ SUSP
40.0000 mg | Freq: Once | INTRAMUSCULAR | Status: AC
  Administered 2022-04-14: 40 mg

## 2022-04-14 NOTE — Progress Notes (Signed)
Numeric Pain Rating Scale and Functional Assessment Average Pain 4   In the last MONTH (on 0-10 scale) has pain interfered with the following?  1. General activity like being  able to carry out your everyday physical activities such as walking, climbing stairs, carrying groceries, or moving a chair?  Rating(7)   +Driver, -BT, -Dye Allergies.  Head going back makes pain worse and pain radiates down into left shoulder and into base of head

## 2022-04-14 NOTE — Patient Instructions (Signed)

## 2022-04-27 NOTE — Procedures (Signed)
Cervical Epidural Steroid Injection - Interlaminar Approach with Fluoroscopic Guidance  Patient: Derrick Munoz      Date of Birth: [birth year] MRN: [id number] PCP: Sunnie Nielsen, DO      Visit Date: 04/14/2022   Universal Protocol:    Date/Time: 11/21/236:17 AM  Consent Given By: the patient  Position: PRONE  Additional Comments: Vital signs were monitored before and after the procedure. Patient was prepped and draped in the usual sterile fashion. The correct patient, procedure, and site was verified.   Injection Procedure Details:   Procedure diagnoses: Cervical radiculopathy [M54.12]    Meds Administered:  Meds ordered this encounter  Medications   methylPREDNISolone acetate (DEPO-MEDROL) injection 40 mg     Laterality: Left  Location/Site: C7-T1  Needle: 3.5 in., 20 ga. Tuohy  Needle Placement: Paramedian epidural space  Findings:  -Comments: Excellent flow of contrast into the epidural space.  Procedure Details: Using a paramedian approach from the side mentioned above, the region overlying the inferior lamina was localized under fluoroscopic visualization and the soft tissues overlying this structure were infiltrated with 4 ml. of 1% Lidocaine without Epinephrine. A # 20 gauge, Tuohy needle was inserted into the epidural space using a paramedian approach.  The epidural space was localized using loss of resistance along with contralateral oblique bi-planar fluoroscopic views.  After negative aspirate for air, blood, and CSF, a 2 ml. volume of Isovue-250 was injected into the epidural space and the flow of contrast was observed. Radiographs were obtained for documentation purposes.   The injectate was administered into the level noted above.  Additional Comments:  The patient tolerated the procedure well Dressing: 2 x 2 sterile gauze and Band-Aid    Post-procedure details: Patient was observed during the procedure. Post-procedure instructions were  reviewed.  Patient left the clinic in stable condition.

## 2022-04-27 NOTE — Progress Notes (Signed)
Derrick Munoz - 41 y.o. male MRN [id number]  Date of birth: Jun 29, 1980  Office Visit Note: Visit Date: 04/14/2022 PCP: Sunnie Nielsen, DO Referred by: Sunnie Nielsen, DO  Subjective: Chief Complaint  Patient presents with   Neck - Pain   HPI:  Derrick Munoz is a 41 y.o. male who comes in today at the request of Dr. Burnard Bunting and Ellin Goodie, FNP for planned Left C7-T1 Cervical Interlaminar epidural steroid injection with fluoroscopic guidance.  The patient has failed conservative care including home exercise, medications, time and activity modification.  This injection will be diagnostic and hopefully therapeutic.  Please see requesting physician notes for further details and justification.   ROS Otherwise per HPI.  Assessment & Plan: Visit Diagnoses:    ICD-10-CM   1. Cervical radiculopathy  M54.12 XR C-ARM NO REPORT    Epidural Steroid injection    methylPREDNISolone acetate (DEPO-MEDROL) injection 40 mg      Plan: No additional findings.   Meds & Orders:  Meds ordered this encounter  Medications   methylPREDNISolone acetate (DEPO-MEDROL) injection 40 mg    Orders Placed This Encounter  Procedures   XR C-ARM NO REPORT   Epidural Steroid injection    Follow-up: Return for visit to requesting provider as needed.   Procedures: No procedures performed  Cervical Epidural Steroid Injection - Interlaminar Approach with Fluoroscopic Guidance  Patient: Derrick Munoz      Date of Birth: Jan 15, 1981 MRN: [id number] PCP: Sunnie Nielsen, DO      Visit Date: 04/14/2022   Universal Protocol:    Date/Time: 11/21/236:17 AM  Consent Given By: the patient  Position: PRONE  Additional Comments: Vital signs were monitored before and after the procedure. Patient was prepped and draped in the usual sterile fashion. The correct patient, procedure, and site was verified.   Injection Procedure Details:   Procedure diagnoses: Cervical radiculopathy [M54.12]     Meds Administered:  Meds ordered this encounter  Medications   methylPREDNISolone acetate (DEPO-MEDROL) injection 40 mg     Laterality: Left  Location/Site: C7-T1  Needle: 3.5 in., 20 ga. Tuohy  Needle Placement: Paramedian epidural space  Findings:  -Comments: Excellent flow of contrast into the epidural space.  Procedure Details: Using a paramedian approach from the side mentioned above, the region overlying the inferior lamina was localized under fluoroscopic visualization and the soft tissues overlying this structure were infiltrated with 4 ml. of 1% Lidocaine without Epinephrine. A # 20 gauge, Tuohy needle was inserted into the epidural space using a paramedian approach.  The epidural space was localized using loss of resistance along with contralateral oblique bi-planar fluoroscopic views.  After negative aspirate for air, blood, and CSF, a 2 ml. volume of Isovue-250 was injected into the epidural space and the flow of contrast was observed. Radiographs were obtained for documentation purposes.   The injectate was administered into the level noted above.  Additional Comments:  The patient tolerated the procedure well Dressing: 2 x 2 sterile gauze and Band-Aid    Post-procedure details: Patient was observed during the procedure. Post-procedure instructions were reviewed.  Patient left the clinic in stable condition.   Clinical History: EXAM: MRI CERVICAL SPINE WITHOUT CONTRAST   TECHNIQUE: Multiplanar, multisequence MR imaging of the cervical spine was performed. No intravenous contrast was administered.   COMPARISON:  MRI the cervical spine 11/02/2015   FINDINGS: Alignment: Study is mildly degraded by patient motion. Slight retrolisthesis at C4-5 and C5-6 is stable.   Vertebrae: Study is  degraded by patient motion. Edematous endplate changes are noted at C5-6. Marrow signal and vertebral body heights are otherwise normal.   Cord: Normal signal and  morphology.   Posterior Fossa, vertebral arteries, paraspinal tissues: Craniocervical junction is normal. Flow is present in the vertebral arteries bilaterally. Visualized intracranial contents are normal.   Disc levels:   C2-3: Uncovertebral spurring is progressed. No significant stenosis is present.   C3-4: A shallow central disc protrusion present. Uncovertebral spurring leads to moderate bilateral foraminal stenosis, right greater than left. This is stable.   C4-5: A left paramedian disc protrusion demonstrates some progression. This contacts the ventral surface the cord. Moderate left and mild right foraminal stenosis is present.   C5-6: A rightward disc osteophyte complex is present. This effaces the ventral CSF. Progressive severe right and moderate left foraminal stenosis is present.   C6-7: A broad-based disc osteophyte complex is asymmetric to the left. CCC moderate left foraminal stenosis is stable. Mild right foraminal narrowing is stable.   C7-T1: A leftward disc osteophyte complex present. Moderate foraminal stenosis has progressed bilaterally, left greater than right.   IMPRESSION: 1. Progressive multilevel spondylosis of the cervical spine as described. 2. Moderate bilateral foraminal stenosis at C3-4 has progressed, right greater than left. 3. Moderate left and mild right foraminal stenosis at C4-5 demonstrates some progression. 4. Progressive severe right and moderate left foraminal stenosis at C5-6. 5. Moderate left and mild right foraminal stenosis at C6-7 is stable. 6. Moderate foraminal stenosis bilaterally at C7-T1 has progressed, left greater than right.     Electronically Signed   By: Marin Roberts M.D.   On: 03/30/2022 19:28     Objective:  VS:  HT:    WT:   BMI:     BP:129/86  HR:76bpm  TEMP: ( )  RESP:  Physical Exam Vitals and nursing note reviewed.  Constitutional:      General: He is not in acute distress.     Appearance: Normal appearance. He is not ill-appearing.  HENT:     Head: Normocephalic and atraumatic.     Right Ear: External ear normal.     Left Ear: External ear normal.  Eyes:     Extraocular Movements: Extraocular movements intact.  Cardiovascular:     Rate and Rhythm: Normal rate.     Pulses: Normal pulses.  Abdominal:     General: There is no distension.     Palpations: Abdomen is soft.  Musculoskeletal:        General: No signs of injury.     Cervical back: Neck supple. Tenderness present. No rigidity.     Right lower leg: No edema.     Left lower leg: No edema.     Comments: Patient has good strength in the upper extremities with 5 out of 5 strength in wrist extension long finger flexion APB.  No intrinsic hand muscle atrophy.  Negative Hoffmann's test.  Lymphadenopathy:     Cervical: No cervical adenopathy.  Skin:    Findings: No erythema or rash.  Neurological:     General: No focal deficit present.     Mental Status: He is alert and oriented to person, place, and time.     Sensory: No sensory deficit.     Motor: No weakness or abnormal muscle tone.     Coordination: Coordination normal.  Psychiatric:        Mood and Affect: Mood normal.        Behavior: Behavior normal.  Imaging: No results found.

## 2022-05-05 ENCOUNTER — Ambulatory Visit: Payer: Federal, State, Local not specified - PPO | Admitting: Family Medicine

## 2022-05-05 ENCOUNTER — Encounter: Payer: Self-pay | Admitting: Family Medicine

## 2022-05-05 VITALS — BP 124/71 | HR 70 | Temp 98.0°F | Ht 71.0 in | Wt 196.2 lb

## 2022-05-05 DIAGNOSIS — M5412 Radiculopathy, cervical region: Secondary | ICD-10-CM | POA: Diagnosis not present

## 2022-05-05 DIAGNOSIS — G4726 Circadian rhythm sleep disorder, shift work type: Secondary | ICD-10-CM

## 2022-05-05 DIAGNOSIS — L409 Psoriasis, unspecified: Secondary | ICD-10-CM | POA: Diagnosis not present

## 2022-05-05 DIAGNOSIS — Z7689 Persons encountering health services in other specified circumstances: Secondary | ICD-10-CM

## 2022-05-05 MED ORDER — KETOCONAZOLE 2 % EX SHAM: MEDICATED_SHAMPOO | CUTANEOUS | 0 refills | Status: DC

## 2022-05-05 MED ORDER — TRIAMCINOLONE ACETONIDE 0.1 % EX OINT: 1.0000 | TOPICAL_OINTMENT | Freq: Two times a day (BID) | CUTANEOUS | 1 refills | Status: AC
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
## 2022-05-06 ENCOUNTER — Encounter: Payer: Self-pay | Admitting: Family Medicine

## 2022-05-06 DIAGNOSIS — L409 Psoriasis, unspecified: Secondary | ICD-10-CM | POA: Insufficient documentation

## 2022-05-06 DIAGNOSIS — G4726 Circadian rhythm sleep disorder, shift work type: Secondary | ICD-10-CM | POA: Insufficient documentation

## 2022-05-06 DIAGNOSIS — M5412 Radiculopathy, cervical region: Secondary | ICD-10-CM | POA: Insufficient documentation

## 2022-05-28 ENCOUNTER — Telehealth: Payer: Self-pay | Admitting: Family Medicine

## 2022-05-28 NOTE — Telephone Encounter (Signed)
Received in by fax box from Rheumatology that referral for psoriasis was denied. They recommended pt see dermatology. Please inform pt of this and inquire if there's a location/provider that he prefers? If so please input and pend that referral.

## 2022-06-01 ENCOUNTER — Other Ambulatory Visit: Payer: Self-pay | Admitting: Family Medicine

## 2022-06-01 DIAGNOSIS — L409 Psoriasis, unspecified: Secondary | ICD-10-CM

## 2022-06-16 ENCOUNTER — Ambulatory Visit (INDEPENDENT_AMBULATORY_CARE_PROVIDER_SITE_OTHER): Payer: Federal, State, Local not specified - PPO | Admitting: Family Medicine

## 2022-06-16 ENCOUNTER — Encounter: Payer: Self-pay | Admitting: Family Medicine

## 2022-06-16 VITALS — BP 122/64 | HR 89 | Temp 98.4°F | Ht 71.0 in | Wt 197.0 lb

## 2022-06-16 DIAGNOSIS — Z136 Encounter for screening for cardiovascular disorders: Secondary | ICD-10-CM

## 2022-06-16 DIAGNOSIS — Z13220 Encounter for screening for lipoid disorders: Secondary | ICD-10-CM

## 2022-06-16 DIAGNOSIS — R7302 Impaired glucose tolerance (oral): Secondary | ICD-10-CM | POA: Diagnosis not present

## 2022-06-16 DIAGNOSIS — R5382 Chronic fatigue, unspecified: Secondary | ICD-10-CM

## 2022-06-16 DIAGNOSIS — G4726 Circadian rhythm sleep disorder, shift work type: Secondary | ICD-10-CM

## 2022-06-16 DIAGNOSIS — M5412 Radiculopathy, cervical region: Secondary | ICD-10-CM

## 2022-06-16 DIAGNOSIS — Z1159 Encounter for screening for other viral diseases: Secondary | ICD-10-CM | POA: Diagnosis not present

## 2022-06-16 DIAGNOSIS — Z0000 Encounter for general adult medical examination without abnormal findings: Secondary | ICD-10-CM | POA: Diagnosis not present

## 2022-06-16 LAB — CBC WITH DIFFERENTIAL/PLATELET
BASO#: 2 /uL
BASO(ABSOLUTE): 0.1
EOS (ABSOLUTE): 0.1
EOS: 2 %
Grans (Absolute): 0
HCT: 41 (ref 29–41)
Hemoglobin: 14.5
Immature Granulocytes: 0
LYMPH: 33 %
Lymphs Abs: 1
MCH: 29.4
MCHC: 35.2
MCV: 83 (ref 76–111)
Monocytes(Absolute): 0.2
Monocytes: 7
Neutrophils absolute (GR#): 1.7 10*3/uL (ref 1.7–7.7)
Neutrophils: 56
Platelets: 252
RBC: 4.94 (ref 3.87–5.11)
RDW: 12.8
WBC: 3

## 2022-06-16 MED ORDER — BACLOFEN 5 MG PO TABS: 5.0000 mg | ORAL_TABLET | Freq: Three times a day (TID) | ORAL | 1 refills | Status: AC | PRN
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
## 2022-06-18 ENCOUNTER — Encounter: Payer: Self-pay | Admitting: Physical Therapy

## 2022-06-18 ENCOUNTER — Other Ambulatory Visit: Payer: Self-pay

## 2022-06-18 ENCOUNTER — Ambulatory Visit: Payer: Federal, State, Local not specified - PPO | Attending: Family Medicine | Admitting: Physical Therapy

## 2022-06-18 DIAGNOSIS — M542 Cervicalgia: Secondary | ICD-10-CM

## 2022-06-18 DIAGNOSIS — M62838 Other muscle spasm: Secondary | ICD-10-CM

## 2022-06-18 DIAGNOSIS — M5412 Radiculopathy, cervical region: Secondary | ICD-10-CM | POA: Insufficient documentation
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
## 2022-06-19 ENCOUNTER — Encounter: Payer: Self-pay | Admitting: Family Medicine

## 2022-06-22 ENCOUNTER — Ambulatory Visit: Payer: Federal, State, Local not specified - PPO

## 2022-06-22 DIAGNOSIS — M5412 Radiculopathy, cervical region: Secondary | ICD-10-CM | POA: Diagnosis not present

## 2022-06-22 DIAGNOSIS — M542 Cervicalgia: Secondary | ICD-10-CM

## 2022-06-22 DIAGNOSIS — M62838 Other muscle spasm: Secondary | ICD-10-CM
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
## 2022-06-24 ENCOUNTER — Encounter: Payer: Self-pay | Admitting: Family Medicine

## 2022-06-24 ENCOUNTER — Other Ambulatory Visit: Payer: Self-pay | Admitting: Family Medicine

## 2022-06-24 LAB — TESTOSTERONE, FREE, TOTAL, SHBG
Sex Hormone Binding: 43.3 nmol/L (ref 16.5–55.9)
Testosterone, Free: 6.8 pg/mL (ref 6.8–21.5)
Testosterone: 265 ng/dL (ref 264–916)

## 2022-06-24 LAB — LIPID PANEL
Chol/HDL Ratio: 3.4 ratio (ref 0.0–5.0)
Cholesterol, Total: 172 mg/dL (ref 100–199)
HDL: 51 mg/dL (ref >39)
LDL Chol Calc (NIH): 108 mg/dL — ABNORMAL HIGH (ref 0–99)
Triglycerides: 67 mg/dL (ref 0–149)
VLDL Cholesterol Cal: 13 mg/dL (ref 5–40)

## 2022-06-24 LAB — COMPREHENSIVE METABOLIC PANEL
ALT: 27 IU/L (ref 0–44)
AST: 32 IU/L (ref 0–40)
Albumin/Globulin Ratio: 2 (ref 1.2–2.2)
Albumin: 4.9 g/dL (ref 4.1–5.1)
Alkaline Phosphatase: 92 IU/L (ref 44–121)
BUN/Creatinine Ratio: 15 (ref 9–20)
BUN: 14 mg/dL (ref 6–24)
Bilirubin Total: 0.8 mg/dL (ref 0.0–1.2)
CO2: 22 mmol/L (ref 20–29)
Calcium: 9.6 mg/dL (ref 8.7–10.2)
Chloride: 104 mmol/L (ref 96–106)
Creatinine, Ser: 0.96 mg/dL (ref 0.76–1.27)
Globulin, Total: 2.4 g/dL (ref 1.5–4.5)
Glucose: 90 mg/dL (ref 70–99)
Potassium: 3.8 mmol/L (ref 3.5–5.2)
Sodium: 143 mmol/L (ref 134–144)
Total Protein: 7.3 g/dL (ref 6.0–8.5)
eGFR: 102 mL/min/{1.73_m2} (ref >59)

## 2022-06-24 LAB — TSH+FREE T4
Free T4: 1.72 ng/dL (ref 0.82–1.77)
TSH: 2.77 u[IU]/mL (ref 0.450–4.500)

## 2022-06-24 LAB — HEMOGLOBIN A1C
Est. average glucose Bld gHb Est-mCnc: 108 mg/dL
Hgb A1c MFr Bld: 5.4 % (ref 4.8–5.6)

## 2022-06-24 LAB — HIV ANTIBODY (ROUTINE TESTING W REFLEX): HIV Screen 4th Generation wRfx: NONREACTIVE

## 2022-06-24 LAB — HEPATITIS C ANTIBODY: Hep C Virus Ab: NONREACTIVE

## 2022-06-29 ENCOUNTER — Ambulatory Visit: Payer: Federal, State, Local not specified - PPO

## 2022-07-02 ENCOUNTER — Encounter: Payer: Federal, State, Local not specified - PPO | Admitting: Physical Therapy

## 2022-07-14 ENCOUNTER — Other Ambulatory Visit: Payer: Self-pay | Admitting: Family Medicine

## 2022-07-14 DIAGNOSIS — L409 Psoriasis, unspecified: Secondary | ICD-10-CM

## 2023-04-26 ENCOUNTER — Encounter: Payer: Self-pay | Admitting: Family Medicine

## 2023-06-20 ENCOUNTER — Encounter: Payer: BC Managed Care – PPO | Admitting: Family Medicine
